# Patient Record
Sex: Female | Born: 1945
Health system: Southern US, Community
[De-identification: ages and names within clinical notes are randomized; demographics above are authoritative.]

---

## 2011-05-25 ENCOUNTER — Other Ambulatory Visit: Payer: Self-pay | Admitting: Neurosurgery

## 2011-05-25 DIAGNOSIS — M4722 Other spondylosis with radiculopathy, cervical region: Secondary | ICD-10-CM

## 2011-05-27 ENCOUNTER — Ambulatory Visit
Admission: RE | Admit: 2011-05-27 | Discharge: 2011-05-27 | Disposition: A | Discharge status: Home / Self Care | Payer: Medicare Other | Source: Ambulatory Visit | Attending: Neurosurgery | Admitting: Neurosurgery

## 2011-05-27 DIAGNOSIS — M4722 Other spondylosis with radiculopathy, cervical region: Secondary | ICD-10-CM

## 2011-05-27 MED ORDER — TRIAMCINOLONE ACETONIDE 40 MG/ML IJ SUSP (RADIOLOGY)
60.0000 mg | Freq: Once | INTRAMUSCULAR | Status: AC
  Administered 2011-05-27: 60 mg via EPIDURAL

## 2011-05-27 MED ORDER — IOHEXOL 300 MG/ML  SOLN
1.0000 mL | Freq: Once | INTRAMUSCULAR | Status: AC | PRN
  Administered 2011-05-27: 1 mL via EPIDURAL

## 2015-03-09 ENCOUNTER — Emergency Department
Admission: EM | Admit: 2015-03-09 | Discharge: 2015-03-09 | Discharge status: Absent without leave | Payer: Medicare Other

## 2015-11-24 DIAGNOSIS — Z789 Other specified health status: Secondary | ICD-10-CM | POA: Diagnosis not present

## 2015-11-24 DIAGNOSIS — E1165 Type 2 diabetes mellitus with hyperglycemia: Secondary | ICD-10-CM | POA: Diagnosis not present

## 2015-11-24 DIAGNOSIS — I10 Essential (primary) hypertension: Secondary | ICD-10-CM | POA: Diagnosis not present

## 2016-02-27 DIAGNOSIS — E1165 Type 2 diabetes mellitus with hyperglycemia: Secondary | ICD-10-CM | POA: Diagnosis not present

## 2016-02-27 DIAGNOSIS — Z299 Encounter for prophylactic measures, unspecified: Secondary | ICD-10-CM | POA: Diagnosis not present

## 2016-02-27 DIAGNOSIS — E785 Hyperlipidemia, unspecified: Secondary | ICD-10-CM | POA: Diagnosis not present

## 2016-02-27 DIAGNOSIS — I10 Essential (primary) hypertension: Secondary | ICD-10-CM | POA: Diagnosis not present

## 2016-06-04 DIAGNOSIS — I10 Essential (primary) hypertension: Secondary | ICD-10-CM | POA: Diagnosis not present

## 2016-06-04 DIAGNOSIS — Z299 Encounter for prophylactic measures, unspecified: Secondary | ICD-10-CM | POA: Diagnosis not present

## 2016-06-04 DIAGNOSIS — E1165 Type 2 diabetes mellitus with hyperglycemia: Secondary | ICD-10-CM | POA: Diagnosis not present

## 2016-07-18 DIAGNOSIS — Z23 Encounter for immunization: Secondary | ICD-10-CM | POA: Diagnosis not present

## 2016-08-16 DIAGNOSIS — Z79899 Other long term (current) drug therapy: Secondary | ICD-10-CM | POA: Diagnosis not present

## 2016-08-16 DIAGNOSIS — Z1389 Encounter for screening for other disorder: Secondary | ICD-10-CM | POA: Diagnosis not present

## 2016-08-16 DIAGNOSIS — F419 Anxiety disorder, unspecified: Secondary | ICD-10-CM | POA: Diagnosis not present

## 2016-08-16 DIAGNOSIS — Z299 Encounter for prophylactic measures, unspecified: Secondary | ICD-10-CM | POA: Diagnosis not present

## 2016-08-16 DIAGNOSIS — E7800 Pure hypercholesterolemia, unspecified: Secondary | ICD-10-CM | POA: Diagnosis not present

## 2016-08-16 DIAGNOSIS — Z7189 Other specified counseling: Secondary | ICD-10-CM | POA: Diagnosis not present

## 2016-08-16 DIAGNOSIS — Z0000 Encounter for general adult medical examination without abnormal findings: Secondary | ICD-10-CM | POA: Diagnosis not present

## 2016-08-16 DIAGNOSIS — Z1211 Encounter for screening for malignant neoplasm of colon: Secondary | ICD-10-CM | POA: Diagnosis not present

## 2016-08-16 DIAGNOSIS — Z6830 Body mass index (BMI) 30.0-30.9, adult: Secondary | ICD-10-CM | POA: Diagnosis not present

## 2016-08-16 DIAGNOSIS — E1165 Type 2 diabetes mellitus with hyperglycemia: Secondary | ICD-10-CM | POA: Diagnosis not present

## 2016-09-01 DIAGNOSIS — E119 Type 2 diabetes mellitus without complications: Secondary | ICD-10-CM | POA: Diagnosis not present

## 2016-09-01 DIAGNOSIS — H40053 Ocular hypertension, bilateral: Secondary | ICD-10-CM | POA: Diagnosis not present

## 2016-09-16 DIAGNOSIS — H40002 Preglaucoma, unspecified, left eye: Secondary | ICD-10-CM | POA: Diagnosis not present

## 2016-09-16 DIAGNOSIS — E2839 Other primary ovarian failure: Secondary | ICD-10-CM | POA: Diagnosis not present

## 2016-09-16 DIAGNOSIS — H40053 Ocular hypertension, bilateral: Secondary | ICD-10-CM | POA: Diagnosis not present

## 2016-11-17 DIAGNOSIS — H04123 Dry eye syndrome of bilateral lacrimal glands: Secondary | ICD-10-CM | POA: Diagnosis not present

## 2016-11-17 DIAGNOSIS — H40053 Ocular hypertension, bilateral: Secondary | ICD-10-CM | POA: Diagnosis not present

## 2017-06-19 DIAGNOSIS — Z23 Encounter for immunization: Secondary | ICD-10-CM | POA: Diagnosis not present

## 2017-08-09 DIAGNOSIS — H40053 Ocular hypertension, bilateral: Secondary | ICD-10-CM | POA: Diagnosis not present

## 2017-08-19 DIAGNOSIS — F419 Anxiety disorder, unspecified: Secondary | ICD-10-CM | POA: Diagnosis not present

## 2017-08-19 DIAGNOSIS — Z6831 Body mass index (BMI) 31.0-31.9, adult: Secondary | ICD-10-CM | POA: Diagnosis not present

## 2017-08-19 DIAGNOSIS — Z299 Encounter for prophylactic measures, unspecified: Secondary | ICD-10-CM | POA: Diagnosis not present

## 2017-08-19 DIAGNOSIS — Z79899 Other long term (current) drug therapy: Secondary | ICD-10-CM | POA: Diagnosis not present

## 2017-08-19 DIAGNOSIS — R5383 Other fatigue: Secondary | ICD-10-CM | POA: Diagnosis not present

## 2017-08-19 DIAGNOSIS — E1165 Type 2 diabetes mellitus with hyperglycemia: Secondary | ICD-10-CM | POA: Diagnosis not present

## 2017-08-19 DIAGNOSIS — Z0000 Encounter for general adult medical examination without abnormal findings: Secondary | ICD-10-CM | POA: Diagnosis not present

## 2017-08-19 DIAGNOSIS — I10 Essential (primary) hypertension: Secondary | ICD-10-CM | POA: Diagnosis not present

## 2017-08-19 DIAGNOSIS — Z1331 Encounter for screening for depression: Secondary | ICD-10-CM | POA: Diagnosis not present

## 2017-08-19 DIAGNOSIS — E7800 Pure hypercholesterolemia, unspecified: Secondary | ICD-10-CM | POA: Diagnosis not present

## 2017-08-19 DIAGNOSIS — Z1339 Encounter for screening examination for other mental health and behavioral disorders: Secondary | ICD-10-CM | POA: Diagnosis not present

## 2017-08-19 DIAGNOSIS — Z1211 Encounter for screening for malignant neoplasm of colon: Secondary | ICD-10-CM | POA: Diagnosis not present

## 2017-08-19 DIAGNOSIS — Z7189 Other specified counseling: Secondary | ICD-10-CM | POA: Diagnosis not present

## 2017-11-29 DIAGNOSIS — Z6831 Body mass index (BMI) 31.0-31.9, adult: Secondary | ICD-10-CM | POA: Diagnosis not present

## 2017-11-29 DIAGNOSIS — I10 Essential (primary) hypertension: Secondary | ICD-10-CM | POA: Diagnosis not present

## 2017-11-29 DIAGNOSIS — E1165 Type 2 diabetes mellitus with hyperglycemia: Secondary | ICD-10-CM | POA: Diagnosis not present

## 2017-11-29 DIAGNOSIS — Z789 Other specified health status: Secondary | ICD-10-CM | POA: Diagnosis not present

## 2017-11-29 DIAGNOSIS — Z299 Encounter for prophylactic measures, unspecified: Secondary | ICD-10-CM | POA: Diagnosis not present

## 2018-01-25 DIAGNOSIS — H40053 Ocular hypertension, bilateral: Secondary | ICD-10-CM | POA: Diagnosis not present

## 2018-03-16 DIAGNOSIS — Z713 Dietary counseling and surveillance: Secondary | ICD-10-CM | POA: Diagnosis not present

## 2018-03-16 DIAGNOSIS — Z299 Encounter for prophylactic measures, unspecified: Secondary | ICD-10-CM | POA: Diagnosis not present

## 2018-03-16 DIAGNOSIS — I10 Essential (primary) hypertension: Secondary | ICD-10-CM | POA: Diagnosis not present

## 2018-03-16 DIAGNOSIS — Z6831 Body mass index (BMI) 31.0-31.9, adult: Secondary | ICD-10-CM | POA: Diagnosis not present

## 2018-03-16 DIAGNOSIS — E1165 Type 2 diabetes mellitus with hyperglycemia: Secondary | ICD-10-CM | POA: Diagnosis not present

## 2018-04-04 DIAGNOSIS — I10 Essential (primary) hypertension: Secondary | ICD-10-CM | POA: Diagnosis not present

## 2018-04-04 DIAGNOSIS — M4802 Spinal stenosis, cervical region: Secondary | ICD-10-CM | POA: Diagnosis not present

## 2018-04-04 DIAGNOSIS — Z299 Encounter for prophylactic measures, unspecified: Secondary | ICD-10-CM | POA: Diagnosis not present

## 2018-04-04 DIAGNOSIS — M50323 Other cervical disc degeneration at C6-C7 level: Secondary | ICD-10-CM | POA: Diagnosis not present

## 2018-04-04 DIAGNOSIS — M542 Cervicalgia: Secondary | ICD-10-CM | POA: Diagnosis not present

## 2018-04-04 DIAGNOSIS — Z6830 Body mass index (BMI) 30.0-30.9, adult: Secondary | ICD-10-CM | POA: Diagnosis not present

## 2018-04-04 DIAGNOSIS — E1165 Type 2 diabetes mellitus with hyperglycemia: Secondary | ICD-10-CM | POA: Diagnosis not present

## 2018-04-04 DIAGNOSIS — M50322 Other cervical disc degeneration at C5-C6 level: Secondary | ICD-10-CM | POA: Diagnosis not present

## 2018-04-10 DIAGNOSIS — M2560 Stiffness of unspecified joint, not elsewhere classified: Secondary | ICD-10-CM | POA: Diagnosis not present

## 2018-04-10 DIAGNOSIS — M25511 Pain in right shoulder: Secondary | ICD-10-CM | POA: Diagnosis not present

## 2018-04-10 DIAGNOSIS — M6250 Muscle wasting and atrophy, not elsewhere classified, unspecified site: Secondary | ICD-10-CM | POA: Diagnosis not present

## 2018-04-10 DIAGNOSIS — M542 Cervicalgia: Secondary | ICD-10-CM | POA: Diagnosis not present

## 2018-04-18 DIAGNOSIS — M542 Cervicalgia: Secondary | ICD-10-CM | POA: Diagnosis not present

## 2018-04-18 DIAGNOSIS — M2560 Stiffness of unspecified joint, not elsewhere classified: Secondary | ICD-10-CM | POA: Diagnosis not present

## 2018-04-18 DIAGNOSIS — M25511 Pain in right shoulder: Secondary | ICD-10-CM | POA: Diagnosis not present

## 2018-04-18 DIAGNOSIS — M6250 Muscle wasting and atrophy, not elsewhere classified, unspecified site: Secondary | ICD-10-CM | POA: Diagnosis not present

## 2018-04-20 DIAGNOSIS — M542 Cervicalgia: Secondary | ICD-10-CM | POA: Diagnosis not present

## 2018-04-20 DIAGNOSIS — M25511 Pain in right shoulder: Secondary | ICD-10-CM | POA: Diagnosis not present

## 2018-04-20 DIAGNOSIS — M6250 Muscle wasting and atrophy, not elsewhere classified, unspecified site: Secondary | ICD-10-CM | POA: Diagnosis not present

## 2018-04-20 DIAGNOSIS — M2560 Stiffness of unspecified joint, not elsewhere classified: Secondary | ICD-10-CM | POA: Diagnosis not present

## 2018-04-24 DIAGNOSIS — M25511 Pain in right shoulder: Secondary | ICD-10-CM | POA: Diagnosis not present

## 2018-04-24 DIAGNOSIS — M6250 Muscle wasting and atrophy, not elsewhere classified, unspecified site: Secondary | ICD-10-CM | POA: Diagnosis not present

## 2018-04-24 DIAGNOSIS — M2560 Stiffness of unspecified joint, not elsewhere classified: Secondary | ICD-10-CM | POA: Diagnosis not present

## 2018-04-24 DIAGNOSIS — M542 Cervicalgia: Secondary | ICD-10-CM | POA: Diagnosis not present

## 2018-04-25 DIAGNOSIS — M25511 Pain in right shoulder: Secondary | ICD-10-CM | POA: Diagnosis not present

## 2018-04-25 DIAGNOSIS — M542 Cervicalgia: Secondary | ICD-10-CM | POA: Diagnosis not present

## 2018-04-25 DIAGNOSIS — M6250 Muscle wasting and atrophy, not elsewhere classified, unspecified site: Secondary | ICD-10-CM | POA: Diagnosis not present

## 2018-04-25 DIAGNOSIS — M2560 Stiffness of unspecified joint, not elsewhere classified: Secondary | ICD-10-CM | POA: Diagnosis not present

## 2018-04-28 DIAGNOSIS — Z299 Encounter for prophylactic measures, unspecified: Secondary | ICD-10-CM | POA: Diagnosis not present

## 2018-04-28 DIAGNOSIS — E1165 Type 2 diabetes mellitus with hyperglycemia: Secondary | ICD-10-CM | POA: Diagnosis not present

## 2018-04-28 DIAGNOSIS — Z6829 Body mass index (BMI) 29.0-29.9, adult: Secondary | ICD-10-CM | POA: Diagnosis not present

## 2018-04-28 DIAGNOSIS — I10 Essential (primary) hypertension: Secondary | ICD-10-CM | POA: Diagnosis not present

## 2018-04-28 DIAGNOSIS — M542 Cervicalgia: Secondary | ICD-10-CM | POA: Diagnosis not present

## 2018-05-02 DIAGNOSIS — M6250 Muscle wasting and atrophy, not elsewhere classified, unspecified site: Secondary | ICD-10-CM | POA: Diagnosis not present

## 2018-05-02 DIAGNOSIS — M25511 Pain in right shoulder: Secondary | ICD-10-CM | POA: Diagnosis not present

## 2018-05-02 DIAGNOSIS — M2560 Stiffness of unspecified joint, not elsewhere classified: Secondary | ICD-10-CM | POA: Diagnosis not present

## 2018-05-02 DIAGNOSIS — M542 Cervicalgia: Secondary | ICD-10-CM | POA: Diagnosis not present

## 2018-05-04 DIAGNOSIS — M25511 Pain in right shoulder: Secondary | ICD-10-CM | POA: Diagnosis not present

## 2018-05-04 DIAGNOSIS — M2560 Stiffness of unspecified joint, not elsewhere classified: Secondary | ICD-10-CM | POA: Diagnosis not present

## 2018-05-04 DIAGNOSIS — M6250 Muscle wasting and atrophy, not elsewhere classified, unspecified site: Secondary | ICD-10-CM | POA: Diagnosis not present

## 2018-05-04 DIAGNOSIS — M542 Cervicalgia: Secondary | ICD-10-CM | POA: Diagnosis not present

## 2018-05-05 DIAGNOSIS — M50221 Other cervical disc displacement at C4-C5 level: Secondary | ICD-10-CM | POA: Diagnosis not present

## 2018-05-05 DIAGNOSIS — M5023 Other cervical disc displacement, cervicothoracic region: Secondary | ICD-10-CM | POA: Diagnosis not present

## 2018-05-05 DIAGNOSIS — M4803 Spinal stenosis, cervicothoracic region: Secondary | ICD-10-CM | POA: Diagnosis not present

## 2018-05-05 DIAGNOSIS — M50222 Other cervical disc displacement at C5-C6 level: Secondary | ICD-10-CM | POA: Diagnosis not present

## 2018-05-05 DIAGNOSIS — M47812 Spondylosis without myelopathy or radiculopathy, cervical region: Secondary | ICD-10-CM | POA: Diagnosis not present

## 2018-05-05 DIAGNOSIS — M5410 Radiculopathy, site unspecified: Secondary | ICD-10-CM | POA: Diagnosis not present

## 2018-05-05 DIAGNOSIS — M4802 Spinal stenosis, cervical region: Secondary | ICD-10-CM | POA: Diagnosis not present

## 2018-05-09 DIAGNOSIS — M542 Cervicalgia: Secondary | ICD-10-CM | POA: Diagnosis not present

## 2018-05-09 DIAGNOSIS — I10 Essential (primary) hypertension: Secondary | ICD-10-CM | POA: Diagnosis not present

## 2018-05-09 DIAGNOSIS — Z299 Encounter for prophylactic measures, unspecified: Secondary | ICD-10-CM | POA: Diagnosis not present

## 2018-05-09 DIAGNOSIS — E663 Overweight: Secondary | ICD-10-CM | POA: Diagnosis not present

## 2018-05-09 DIAGNOSIS — Z713 Dietary counseling and surveillance: Secondary | ICD-10-CM | POA: Diagnosis not present

## 2018-06-09 DIAGNOSIS — M47812 Spondylosis without myelopathy or radiculopathy, cervical region: Secondary | ICD-10-CM | POA: Diagnosis not present

## 2018-06-22 DIAGNOSIS — F419 Anxiety disorder, unspecified: Secondary | ICD-10-CM | POA: Diagnosis not present

## 2018-06-22 DIAGNOSIS — E1165 Type 2 diabetes mellitus with hyperglycemia: Secondary | ICD-10-CM | POA: Diagnosis not present

## 2018-06-22 DIAGNOSIS — Z299 Encounter for prophylactic measures, unspecified: Secondary | ICD-10-CM | POA: Diagnosis not present

## 2018-06-22 DIAGNOSIS — Z6829 Body mass index (BMI) 29.0-29.9, adult: Secondary | ICD-10-CM | POA: Diagnosis not present

## 2018-06-22 DIAGNOSIS — I10 Essential (primary) hypertension: Secondary | ICD-10-CM | POA: Diagnosis not present

## 2018-07-30 DIAGNOSIS — Z23 Encounter for immunization: Secondary | ICD-10-CM | POA: Diagnosis not present

## 2018-08-02 DIAGNOSIS — H40053 Ocular hypertension, bilateral: Secondary | ICD-10-CM | POA: Diagnosis not present

## 2018-12-28 DIAGNOSIS — I10 Essential (primary) hypertension: Secondary | ICD-10-CM | POA: Diagnosis not present

## 2018-12-28 DIAGNOSIS — Z6831 Body mass index (BMI) 31.0-31.9, adult: Secondary | ICD-10-CM | POA: Diagnosis not present

## 2018-12-28 DIAGNOSIS — Z713 Dietary counseling and surveillance: Secondary | ICD-10-CM | POA: Diagnosis not present

## 2018-12-28 DIAGNOSIS — Z299 Encounter for prophylactic measures, unspecified: Secondary | ICD-10-CM | POA: Diagnosis not present

## 2018-12-28 DIAGNOSIS — E1165 Type 2 diabetes mellitus with hyperglycemia: Secondary | ICD-10-CM | POA: Diagnosis not present

## 2019-04-05 DIAGNOSIS — Z6829 Body mass index (BMI) 29.0-29.9, adult: Secondary | ICD-10-CM | POA: Diagnosis not present

## 2019-04-05 DIAGNOSIS — Z299 Encounter for prophylactic measures, unspecified: Secondary | ICD-10-CM | POA: Diagnosis not present

## 2019-04-05 DIAGNOSIS — E1165 Type 2 diabetes mellitus with hyperglycemia: Secondary | ICD-10-CM | POA: Diagnosis not present

## 2019-04-05 DIAGNOSIS — I10 Essential (primary) hypertension: Secondary | ICD-10-CM | POA: Diagnosis not present

## 2019-04-05 DIAGNOSIS — Z713 Dietary counseling and surveillance: Secondary | ICD-10-CM | POA: Diagnosis not present

## 2019-07-11 DIAGNOSIS — Z23 Encounter for immunization: Secondary | ICD-10-CM | POA: Diagnosis not present

## 2019-07-11 DIAGNOSIS — Z6830 Body mass index (BMI) 30.0-30.9, adult: Secondary | ICD-10-CM | POA: Diagnosis not present

## 2019-07-11 DIAGNOSIS — E1165 Type 2 diabetes mellitus with hyperglycemia: Secondary | ICD-10-CM | POA: Diagnosis not present

## 2019-07-11 DIAGNOSIS — Z299 Encounter for prophylactic measures, unspecified: Secondary | ICD-10-CM | POA: Diagnosis not present

## 2019-07-11 DIAGNOSIS — R202 Paresthesia of skin: Secondary | ICD-10-CM | POA: Diagnosis not present

## 2019-07-11 DIAGNOSIS — I10 Essential (primary) hypertension: Secondary | ICD-10-CM | POA: Diagnosis not present

## 2019-07-17 DIAGNOSIS — E1140 Type 2 diabetes mellitus with diabetic neuropathy, unspecified: Secondary | ICD-10-CM | POA: Diagnosis not present

## 2019-07-17 DIAGNOSIS — M79604 Pain in right leg: Secondary | ICD-10-CM | POA: Diagnosis not present

## 2019-08-08 DIAGNOSIS — H40053 Ocular hypertension, bilateral: Secondary | ICD-10-CM | POA: Diagnosis not present

## 2019-08-10 DIAGNOSIS — E1142 Type 2 diabetes mellitus with diabetic polyneuropathy: Secondary | ICD-10-CM | POA: Diagnosis not present

## 2019-08-10 DIAGNOSIS — Z6830 Body mass index (BMI) 30.0-30.9, adult: Secondary | ICD-10-CM | POA: Diagnosis not present

## 2019-08-10 DIAGNOSIS — Z299 Encounter for prophylactic measures, unspecified: Secondary | ICD-10-CM | POA: Diagnosis not present

## 2019-08-10 DIAGNOSIS — I10 Essential (primary) hypertension: Secondary | ICD-10-CM | POA: Diagnosis not present

## 2019-08-10 DIAGNOSIS — E1165 Type 2 diabetes mellitus with hyperglycemia: Secondary | ICD-10-CM | POA: Diagnosis not present

## 2019-08-10 DIAGNOSIS — E119 Type 2 diabetes mellitus without complications: Secondary | ICD-10-CM | POA: Diagnosis not present

## 2019-08-10 DIAGNOSIS — R202 Paresthesia of skin: Secondary | ICD-10-CM | POA: Diagnosis not present

## 2019-08-30 DIAGNOSIS — Z03818 Encounter for observation for suspected exposure to other biological agents ruled out: Secondary | ICD-10-CM | POA: Diagnosis not present

## 2019-08-30 DIAGNOSIS — Z20828 Contact with and (suspected) exposure to other viral communicable diseases: Secondary | ICD-10-CM | POA: Diagnosis not present

## 2019-09-02 DIAGNOSIS — Z20828 Contact with and (suspected) exposure to other viral communicable diseases: Secondary | ICD-10-CM | POA: Diagnosis not present

## 2019-09-02 DIAGNOSIS — J069 Acute upper respiratory infection, unspecified: Secondary | ICD-10-CM | POA: Diagnosis not present

## 2019-09-02 DIAGNOSIS — R05 Cough: Secondary | ICD-10-CM | POA: Diagnosis not present

## 2019-09-02 DIAGNOSIS — Z03818 Encounter for observation for suspected exposure to other biological agents ruled out: Secondary | ICD-10-CM | POA: Diagnosis not present

## 2019-09-16 DIAGNOSIS — Z7984 Long term (current) use of oral hypoglycemic drugs: Secondary | ICD-10-CM | POA: Diagnosis not present

## 2019-09-16 DIAGNOSIS — E860 Dehydration: Secondary | ICD-10-CM | POA: Diagnosis not present

## 2019-09-16 DIAGNOSIS — E119 Type 2 diabetes mellitus without complications: Secondary | ICD-10-CM | POA: Diagnosis not present

## 2019-09-16 DIAGNOSIS — I10 Essential (primary) hypertension: Secondary | ICD-10-CM | POA: Diagnosis not present

## 2019-09-16 DIAGNOSIS — Z79899 Other long term (current) drug therapy: Secondary | ICD-10-CM | POA: Diagnosis not present

## 2019-09-16 DIAGNOSIS — U071 COVID-19: Secondary | ICD-10-CM | POA: Diagnosis not present

## 2019-09-16 DIAGNOSIS — M6281 Muscle weakness (generalized): Secondary | ICD-10-CM | POA: Diagnosis not present

## 2019-09-17 DIAGNOSIS — U071 COVID-19: Secondary | ICD-10-CM | POA: Diagnosis not present

## 2019-09-17 DIAGNOSIS — J1289 Other viral pneumonia: Secondary | ICD-10-CM | POA: Diagnosis not present

## 2019-09-17 DIAGNOSIS — B9729 Other coronavirus as the cause of diseases classified elsewhere: Secondary | ICD-10-CM | POA: Diagnosis not present

## 2019-09-17 DIAGNOSIS — J9601 Acute respiratory failure with hypoxia: Secondary | ICD-10-CM | POA: Diagnosis not present

## 2019-09-18 DIAGNOSIS — J1289 Other viral pneumonia: Secondary | ICD-10-CM | POA: Diagnosis not present

## 2019-09-18 DIAGNOSIS — B9729 Other coronavirus as the cause of diseases classified elsewhere: Secondary | ICD-10-CM | POA: Diagnosis not present

## 2019-09-18 DIAGNOSIS — J9601 Acute respiratory failure with hypoxia: Secondary | ICD-10-CM | POA: Diagnosis not present

## 2019-09-18 DIAGNOSIS — U071 COVID-19: Secondary | ICD-10-CM | POA: Diagnosis not present

## 2019-10-11 DIAGNOSIS — E119 Type 2 diabetes mellitus without complications: Secondary | ICD-10-CM | POA: Diagnosis not present

## 2019-10-15 DIAGNOSIS — I10 Essential (primary) hypertension: Secondary | ICD-10-CM | POA: Diagnosis not present

## 2019-10-15 DIAGNOSIS — E1165 Type 2 diabetes mellitus with hyperglycemia: Secondary | ICD-10-CM | POA: Diagnosis not present

## 2019-10-15 DIAGNOSIS — Z6829 Body mass index (BMI) 29.0-29.9, adult: Secondary | ICD-10-CM | POA: Diagnosis not present

## 2019-10-15 DIAGNOSIS — Z299 Encounter for prophylactic measures, unspecified: Secondary | ICD-10-CM | POA: Diagnosis not present

## 2019-10-15 DIAGNOSIS — Z789 Other specified health status: Secondary | ICD-10-CM | POA: Diagnosis not present

## 2019-10-15 DIAGNOSIS — R05 Cough: Secondary | ICD-10-CM | POA: Diagnosis not present

## 2019-10-15 DIAGNOSIS — U071 COVID-19: Secondary | ICD-10-CM | POA: Diagnosis not present

## 2019-11-08 DIAGNOSIS — E119 Type 2 diabetes mellitus without complications: Secondary | ICD-10-CM | POA: Diagnosis not present

## 2019-11-21 DIAGNOSIS — Z79899 Other long term (current) drug therapy: Secondary | ICD-10-CM | POA: Diagnosis not present

## 2019-11-21 DIAGNOSIS — E559 Vitamin D deficiency, unspecified: Secondary | ICD-10-CM | POA: Diagnosis not present

## 2019-11-21 DIAGNOSIS — F419 Anxiety disorder, unspecified: Secondary | ICD-10-CM | POA: Diagnosis not present

## 2019-11-21 DIAGNOSIS — Z299 Encounter for prophylactic measures, unspecified: Secondary | ICD-10-CM | POA: Diagnosis not present

## 2019-11-21 DIAGNOSIS — Z1331 Encounter for screening for depression: Secondary | ICD-10-CM | POA: Diagnosis not present

## 2019-11-21 DIAGNOSIS — E785 Hyperlipidemia, unspecified: Secondary | ICD-10-CM | POA: Diagnosis not present

## 2019-11-21 DIAGNOSIS — Z6830 Body mass index (BMI) 30.0-30.9, adult: Secondary | ICD-10-CM | POA: Diagnosis not present

## 2019-11-21 DIAGNOSIS — Z0000 Encounter for general adult medical examination without abnormal findings: Secondary | ICD-10-CM | POA: Diagnosis not present

## 2019-11-21 DIAGNOSIS — Z1211 Encounter for screening for malignant neoplasm of colon: Secondary | ICD-10-CM | POA: Diagnosis not present

## 2019-11-21 DIAGNOSIS — Z7189 Other specified counseling: Secondary | ICD-10-CM | POA: Diagnosis not present

## 2019-11-21 DIAGNOSIS — R5383 Other fatigue: Secondary | ICD-10-CM | POA: Diagnosis not present

## 2019-11-21 DIAGNOSIS — Z1339 Encounter for screening examination for other mental health and behavioral disorders: Secondary | ICD-10-CM | POA: Diagnosis not present

## 2019-11-21 DIAGNOSIS — I10 Essential (primary) hypertension: Secondary | ICD-10-CM | POA: Diagnosis not present

## 2019-11-23 DIAGNOSIS — E2839 Other primary ovarian failure: Secondary | ICD-10-CM | POA: Diagnosis not present

## 2019-12-09 DIAGNOSIS — E119 Type 2 diabetes mellitus without complications: Secondary | ICD-10-CM | POA: Diagnosis not present

## 2019-12-14 DIAGNOSIS — Z23 Encounter for immunization: Secondary | ICD-10-CM | POA: Diagnosis not present

## 2020-01-08 DIAGNOSIS — E119 Type 2 diabetes mellitus without complications: Secondary | ICD-10-CM | POA: Diagnosis not present

## 2020-01-11 DIAGNOSIS — Z23 Encounter for immunization: Secondary | ICD-10-CM | POA: Diagnosis not present

## 2020-01-21 DIAGNOSIS — E1165 Type 2 diabetes mellitus with hyperglycemia: Secondary | ICD-10-CM | POA: Diagnosis not present

## 2020-01-21 DIAGNOSIS — Z299 Encounter for prophylactic measures, unspecified: Secondary | ICD-10-CM | POA: Diagnosis not present

## 2020-01-21 DIAGNOSIS — I10 Essential (primary) hypertension: Secondary | ICD-10-CM | POA: Diagnosis not present

## 2020-02-06 DIAGNOSIS — H40053 Ocular hypertension, bilateral: Secondary | ICD-10-CM | POA: Diagnosis not present

## 2020-02-08 DIAGNOSIS — E119 Type 2 diabetes mellitus without complications: Secondary | ICD-10-CM | POA: Diagnosis not present

## 2020-03-09 DIAGNOSIS — E119 Type 2 diabetes mellitus without complications: Secondary | ICD-10-CM | POA: Diagnosis not present

## 2020-03-10 DIAGNOSIS — I10 Essential (primary) hypertension: Secondary | ICD-10-CM | POA: Diagnosis not present

## 2020-03-10 DIAGNOSIS — F329 Major depressive disorder, single episode, unspecified: Secondary | ICD-10-CM | POA: Diagnosis not present

## 2020-04-09 DIAGNOSIS — E119 Type 2 diabetes mellitus without complications: Secondary | ICD-10-CM | POA: Diagnosis not present

## 2020-04-09 DIAGNOSIS — F329 Major depressive disorder, single episode, unspecified: Secondary | ICD-10-CM | POA: Diagnosis not present

## 2020-04-09 DIAGNOSIS — I10 Essential (primary) hypertension: Secondary | ICD-10-CM | POA: Diagnosis not present

## 2020-04-24 DIAGNOSIS — E1165 Type 2 diabetes mellitus with hyperglycemia: Secondary | ICD-10-CM | POA: Diagnosis not present

## 2020-04-24 DIAGNOSIS — Z299 Encounter for prophylactic measures, unspecified: Secondary | ICD-10-CM | POA: Diagnosis not present

## 2020-04-24 DIAGNOSIS — Z6829 Body mass index (BMI) 29.0-29.9, adult: Secondary | ICD-10-CM | POA: Diagnosis not present

## 2020-04-24 DIAGNOSIS — I10 Essential (primary) hypertension: Secondary | ICD-10-CM | POA: Diagnosis not present

## 2020-04-24 DIAGNOSIS — E1142 Type 2 diabetes mellitus with diabetic polyneuropathy: Secondary | ICD-10-CM | POA: Diagnosis not present

## 2020-05-09 DIAGNOSIS — E119 Type 2 diabetes mellitus without complications: Secondary | ICD-10-CM | POA: Diagnosis not present

## 2020-06-10 DIAGNOSIS — E119 Type 2 diabetes mellitus without complications: Secondary | ICD-10-CM | POA: Diagnosis not present

## 2020-06-10 DIAGNOSIS — I10 Essential (primary) hypertension: Secondary | ICD-10-CM | POA: Diagnosis not present

## 2020-06-10 DIAGNOSIS — F329 Major depressive disorder, single episode, unspecified: Secondary | ICD-10-CM | POA: Diagnosis not present

## 2020-07-10 DIAGNOSIS — E119 Type 2 diabetes mellitus without complications: Secondary | ICD-10-CM | POA: Diagnosis not present

## 2020-07-11 DIAGNOSIS — Z23 Encounter for immunization: Secondary | ICD-10-CM | POA: Diagnosis not present

## 2020-08-08 DIAGNOSIS — F329 Major depressive disorder, single episode, unspecified: Secondary | ICD-10-CM | POA: Diagnosis not present

## 2020-08-08 DIAGNOSIS — I10 Essential (primary) hypertension: Secondary | ICD-10-CM | POA: Diagnosis not present

## 2020-08-09 DIAGNOSIS — E119 Type 2 diabetes mellitus without complications: Secondary | ICD-10-CM | POA: Diagnosis not present

## 2020-08-21 DIAGNOSIS — E1142 Type 2 diabetes mellitus with diabetic polyneuropathy: Secondary | ICD-10-CM | POA: Diagnosis not present

## 2020-08-21 DIAGNOSIS — Z299 Encounter for prophylactic measures, unspecified: Secondary | ICD-10-CM | POA: Diagnosis not present

## 2020-08-21 DIAGNOSIS — I10 Essential (primary) hypertension: Secondary | ICD-10-CM | POA: Diagnosis not present

## 2020-08-21 DIAGNOSIS — E1165 Type 2 diabetes mellitus with hyperglycemia: Secondary | ICD-10-CM | POA: Diagnosis not present

## 2020-08-25 DIAGNOSIS — Z23 Encounter for immunization: Secondary | ICD-10-CM | POA: Diagnosis not present

## 2020-09-09 DIAGNOSIS — I10 Essential (primary) hypertension: Secondary | ICD-10-CM | POA: Diagnosis not present

## 2020-09-09 DIAGNOSIS — E119 Type 2 diabetes mellitus without complications: Secondary | ICD-10-CM | POA: Diagnosis not present

## 2020-09-09 DIAGNOSIS — F329 Major depressive disorder, single episode, unspecified: Secondary | ICD-10-CM | POA: Diagnosis not present

## 2020-10-09 DIAGNOSIS — E119 Type 2 diabetes mellitus without complications: Secondary | ICD-10-CM | POA: Diagnosis not present

## 2020-10-10 DIAGNOSIS — I10 Essential (primary) hypertension: Secondary | ICD-10-CM | POA: Diagnosis not present

## 2020-10-10 DIAGNOSIS — F329 Major depressive disorder, single episode, unspecified: Secondary | ICD-10-CM | POA: Diagnosis not present

## 2020-11-10 DIAGNOSIS — I10 Essential (primary) hypertension: Secondary | ICD-10-CM | POA: Diagnosis not present

## 2020-11-10 DIAGNOSIS — F329 Major depressive disorder, single episode, unspecified: Secondary | ICD-10-CM | POA: Diagnosis not present

## 2020-11-10 DIAGNOSIS — E119 Type 2 diabetes mellitus without complications: Secondary | ICD-10-CM | POA: Diagnosis not present

## 2020-11-27 DIAGNOSIS — Z1331 Encounter for screening for depression: Secondary | ICD-10-CM | POA: Diagnosis not present

## 2020-11-27 DIAGNOSIS — Z0000 Encounter for general adult medical examination without abnormal findings: Secondary | ICD-10-CM | POA: Diagnosis not present

## 2020-11-27 DIAGNOSIS — Z79899 Other long term (current) drug therapy: Secondary | ICD-10-CM | POA: Diagnosis not present

## 2020-11-27 DIAGNOSIS — Z299 Encounter for prophylactic measures, unspecified: Secondary | ICD-10-CM | POA: Diagnosis not present

## 2020-11-27 DIAGNOSIS — I10 Essential (primary) hypertension: Secondary | ICD-10-CM | POA: Diagnosis not present

## 2020-11-27 DIAGNOSIS — E1165 Type 2 diabetes mellitus with hyperglycemia: Secondary | ICD-10-CM | POA: Diagnosis not present

## 2020-11-27 DIAGNOSIS — R5383 Other fatigue: Secondary | ICD-10-CM | POA: Diagnosis not present

## 2020-11-27 DIAGNOSIS — E7800 Pure hypercholesterolemia, unspecified: Secondary | ICD-10-CM | POA: Diagnosis not present

## 2020-11-27 DIAGNOSIS — Z1339 Encounter for screening examination for other mental health and behavioral disorders: Secondary | ICD-10-CM | POA: Diagnosis not present

## 2020-11-27 DIAGNOSIS — E559 Vitamin D deficiency, unspecified: Secondary | ICD-10-CM | POA: Diagnosis not present

## 2020-11-27 DIAGNOSIS — Z6830 Body mass index (BMI) 30.0-30.9, adult: Secondary | ICD-10-CM | POA: Diagnosis not present

## 2020-11-27 DIAGNOSIS — Z7189 Other specified counseling: Secondary | ICD-10-CM | POA: Diagnosis not present

## 2020-12-08 DIAGNOSIS — E119 Type 2 diabetes mellitus without complications: Secondary | ICD-10-CM | POA: Diagnosis not present

## 2021-01-08 ENCOUNTER — Other Ambulatory Visit: Payer: Self-pay | Admitting: Internal Medicine

## 2021-01-08 DIAGNOSIS — E119 Type 2 diabetes mellitus without complications: Secondary | ICD-10-CM | POA: Diagnosis not present

## 2021-01-08 DIAGNOSIS — Z1231 Encounter for screening mammogram for malignant neoplasm of breast: Secondary | ICD-10-CM

## 2021-01-12 ENCOUNTER — Other Ambulatory Visit: Payer: Self-pay

## 2021-01-12 ENCOUNTER — Ambulatory Visit
Admission: RE | Admit: 2021-01-12 | Discharge: 2021-01-12 | Disposition: A | Discharge status: Home / Self Care | Payer: Medicare Other | Source: Ambulatory Visit | Attending: Internal Medicine | Admitting: Internal Medicine

## 2021-01-12 DIAGNOSIS — Z1231 Encounter for screening mammogram for malignant neoplasm of breast: Secondary | ICD-10-CM | POA: Diagnosis not present

## 2021-01-14 DIAGNOSIS — H2513 Age-related nuclear cataract, bilateral: Secondary | ICD-10-CM | POA: Diagnosis not present

## 2021-01-14 DIAGNOSIS — H40053 Ocular hypertension, bilateral: Secondary | ICD-10-CM | POA: Diagnosis not present

## 2021-02-06 DIAGNOSIS — E119 Type 2 diabetes mellitus without complications: Secondary | ICD-10-CM | POA: Diagnosis not present

## 2021-02-07 DIAGNOSIS — I10 Essential (primary) hypertension: Secondary | ICD-10-CM | POA: Diagnosis not present

## 2021-02-07 DIAGNOSIS — F329 Major depressive disorder, single episode, unspecified: Secondary | ICD-10-CM | POA: Diagnosis not present

## 2021-03-10 DIAGNOSIS — E119 Type 2 diabetes mellitus without complications: Secondary | ICD-10-CM | POA: Diagnosis not present

## 2021-03-13 DIAGNOSIS — E1122 Type 2 diabetes mellitus with diabetic chronic kidney disease: Secondary | ICD-10-CM | POA: Diagnosis not present

## 2021-03-13 DIAGNOSIS — N183 Chronic kidney disease, stage 3 unspecified: Secondary | ICD-10-CM | POA: Diagnosis not present

## 2021-03-13 DIAGNOSIS — E1165 Type 2 diabetes mellitus with hyperglycemia: Secondary | ICD-10-CM | POA: Diagnosis not present

## 2021-03-13 DIAGNOSIS — Z299 Encounter for prophylactic measures, unspecified: Secondary | ICD-10-CM | POA: Diagnosis not present

## 2021-03-13 DIAGNOSIS — Z6830 Body mass index (BMI) 30.0-30.9, adult: Secondary | ICD-10-CM | POA: Diagnosis not present

## 2021-03-13 DIAGNOSIS — I10 Essential (primary) hypertension: Secondary | ICD-10-CM | POA: Diagnosis not present

## 2021-04-09 DIAGNOSIS — E119 Type 2 diabetes mellitus without complications: Secondary | ICD-10-CM | POA: Diagnosis not present

## 2021-05-08 DIAGNOSIS — E119 Type 2 diabetes mellitus without complications: Secondary | ICD-10-CM | POA: Diagnosis not present

## 2021-05-10 DIAGNOSIS — F329 Major depressive disorder, single episode, unspecified: Secondary | ICD-10-CM | POA: Diagnosis not present

## 2021-05-10 DIAGNOSIS — I10 Essential (primary) hypertension: Secondary | ICD-10-CM | POA: Diagnosis not present

## 2021-06-10 DIAGNOSIS — E119 Type 2 diabetes mellitus without complications: Secondary | ICD-10-CM | POA: Diagnosis not present

## 2021-06-17 DIAGNOSIS — Z6830 Body mass index (BMI) 30.0-30.9, adult: Secondary | ICD-10-CM | POA: Diagnosis not present

## 2021-06-17 DIAGNOSIS — Z299 Encounter for prophylactic measures, unspecified: Secondary | ICD-10-CM | POA: Diagnosis not present

## 2021-06-17 DIAGNOSIS — E1165 Type 2 diabetes mellitus with hyperglycemia: Secondary | ICD-10-CM | POA: Diagnosis not present

## 2021-06-17 DIAGNOSIS — I10 Essential (primary) hypertension: Secondary | ICD-10-CM | POA: Diagnosis not present

## 2021-06-17 DIAGNOSIS — N183 Chronic kidney disease, stage 3 unspecified: Secondary | ICD-10-CM | POA: Diagnosis not present

## 2021-06-17 DIAGNOSIS — E1142 Type 2 diabetes mellitus with diabetic polyneuropathy: Secondary | ICD-10-CM | POA: Diagnosis not present

## 2021-06-17 DIAGNOSIS — E1122 Type 2 diabetes mellitus with diabetic chronic kidney disease: Secondary | ICD-10-CM | POA: Diagnosis not present

## 2021-07-10 DIAGNOSIS — I10 Essential (primary) hypertension: Secondary | ICD-10-CM | POA: Diagnosis not present

## 2021-07-10 DIAGNOSIS — E119 Type 2 diabetes mellitus without complications: Secondary | ICD-10-CM | POA: Diagnosis not present

## 2021-07-10 DIAGNOSIS — F329 Major depressive disorder, single episode, unspecified: Secondary | ICD-10-CM | POA: Diagnosis not present

## 2021-08-10 DIAGNOSIS — E119 Type 2 diabetes mellitus without complications: Secondary | ICD-10-CM | POA: Diagnosis not present

## 2021-08-19 DIAGNOSIS — Z23 Encounter for immunization: Secondary | ICD-10-CM | POA: Diagnosis not present

## 2021-09-09 DIAGNOSIS — E7800 Pure hypercholesterolemia, unspecified: Secondary | ICD-10-CM | POA: Diagnosis not present

## 2021-09-09 DIAGNOSIS — I10 Essential (primary) hypertension: Secondary | ICD-10-CM | POA: Diagnosis not present

## 2021-09-09 DIAGNOSIS — E119 Type 2 diabetes mellitus without complications: Secondary | ICD-10-CM | POA: Diagnosis not present

## 2021-09-18 DIAGNOSIS — E1122 Type 2 diabetes mellitus with diabetic chronic kidney disease: Secondary | ICD-10-CM | POA: Diagnosis not present

## 2021-09-18 DIAGNOSIS — N183 Chronic kidney disease, stage 3 unspecified: Secondary | ICD-10-CM | POA: Diagnosis not present

## 2021-09-18 DIAGNOSIS — I10 Essential (primary) hypertension: Secondary | ICD-10-CM | POA: Diagnosis not present

## 2021-09-18 DIAGNOSIS — Z299 Encounter for prophylactic measures, unspecified: Secondary | ICD-10-CM | POA: Diagnosis not present

## 2021-09-18 DIAGNOSIS — E1165 Type 2 diabetes mellitus with hyperglycemia: Secondary | ICD-10-CM | POA: Diagnosis not present

## 2021-10-09 DIAGNOSIS — E119 Type 2 diabetes mellitus without complications: Secondary | ICD-10-CM | POA: Diagnosis not present

## 2021-11-08 DIAGNOSIS — E119 Type 2 diabetes mellitus without complications: Secondary | ICD-10-CM | POA: Diagnosis not present

## 2021-11-30 DIAGNOSIS — Z299 Encounter for prophylactic measures, unspecified: Secondary | ICD-10-CM | POA: Diagnosis not present

## 2021-11-30 DIAGNOSIS — E559 Vitamin D deficiency, unspecified: Secondary | ICD-10-CM | POA: Diagnosis not present

## 2021-11-30 DIAGNOSIS — Z0000 Encounter for general adult medical examination without abnormal findings: Secondary | ICD-10-CM | POA: Diagnosis not present

## 2021-11-30 DIAGNOSIS — Z79899 Other long term (current) drug therapy: Secondary | ICD-10-CM | POA: Diagnosis not present

## 2021-11-30 DIAGNOSIS — Z7189 Other specified counseling: Secondary | ICD-10-CM | POA: Diagnosis not present

## 2021-11-30 DIAGNOSIS — Z789 Other specified health status: Secondary | ICD-10-CM | POA: Diagnosis not present

## 2021-11-30 DIAGNOSIS — R5383 Other fatigue: Secondary | ICD-10-CM | POA: Diagnosis not present

## 2021-11-30 DIAGNOSIS — Z1339 Encounter for screening examination for other mental health and behavioral disorders: Secondary | ICD-10-CM | POA: Diagnosis not present

## 2021-11-30 DIAGNOSIS — Z1331 Encounter for screening for depression: Secondary | ICD-10-CM | POA: Diagnosis not present

## 2021-11-30 DIAGNOSIS — E7800 Pure hypercholesterolemia, unspecified: Secondary | ICD-10-CM | POA: Diagnosis not present

## 2021-11-30 DIAGNOSIS — I10 Essential (primary) hypertension: Secondary | ICD-10-CM | POA: Diagnosis not present

## 2021-11-30 DIAGNOSIS — Z6829 Body mass index (BMI) 29.0-29.9, adult: Secondary | ICD-10-CM | POA: Diagnosis not present

## 2021-12-08 DIAGNOSIS — I10 Essential (primary) hypertension: Secondary | ICD-10-CM | POA: Diagnosis not present

## 2021-12-08 DIAGNOSIS — E119 Type 2 diabetes mellitus without complications: Secondary | ICD-10-CM | POA: Diagnosis not present

## 2021-12-08 DIAGNOSIS — F329 Major depressive disorder, single episode, unspecified: Secondary | ICD-10-CM | POA: Diagnosis not present

## 2021-12-22 DIAGNOSIS — E2839 Other primary ovarian failure: Secondary | ICD-10-CM | POA: Diagnosis not present

## 2022-01-07 DIAGNOSIS — E119 Type 2 diabetes mellitus without complications: Secondary | ICD-10-CM | POA: Diagnosis not present

## 2022-01-11 DIAGNOSIS — I10 Essential (primary) hypertension: Secondary | ICD-10-CM | POA: Diagnosis not present

## 2022-01-11 DIAGNOSIS — E1165 Type 2 diabetes mellitus with hyperglycemia: Secondary | ICD-10-CM | POA: Diagnosis not present

## 2022-01-11 DIAGNOSIS — E1122 Type 2 diabetes mellitus with diabetic chronic kidney disease: Secondary | ICD-10-CM | POA: Diagnosis not present

## 2022-01-11 DIAGNOSIS — N183 Chronic kidney disease, stage 3 unspecified: Secondary | ICD-10-CM | POA: Diagnosis not present

## 2022-01-11 DIAGNOSIS — Z299 Encounter for prophylactic measures, unspecified: Secondary | ICD-10-CM | POA: Diagnosis not present

## 2022-01-11 DIAGNOSIS — Z6830 Body mass index (BMI) 30.0-30.9, adult: Secondary | ICD-10-CM | POA: Diagnosis not present

## 2022-01-11 DIAGNOSIS — Z789 Other specified health status: Secondary | ICD-10-CM | POA: Diagnosis not present

## 2022-01-20 DIAGNOSIS — H2513 Age-related nuclear cataract, bilateral: Secondary | ICD-10-CM | POA: Diagnosis not present

## 2022-01-20 DIAGNOSIS — H40053 Ocular hypertension, bilateral: Secondary | ICD-10-CM | POA: Diagnosis not present

## 2022-02-07 DIAGNOSIS — E119 Type 2 diabetes mellitus without complications: Secondary | ICD-10-CM | POA: Diagnosis not present

## 2022-02-22 DIAGNOSIS — Z789 Other specified health status: Secondary | ICD-10-CM | POA: Diagnosis not present

## 2022-02-22 DIAGNOSIS — I10 Essential (primary) hypertension: Secondary | ICD-10-CM | POA: Diagnosis not present

## 2022-02-22 DIAGNOSIS — Z299 Encounter for prophylactic measures, unspecified: Secondary | ICD-10-CM | POA: Diagnosis not present

## 2022-02-22 DIAGNOSIS — Z6830 Body mass index (BMI) 30.0-30.9, adult: Secondary | ICD-10-CM | POA: Diagnosis not present

## 2022-02-22 DIAGNOSIS — E1165 Type 2 diabetes mellitus with hyperglycemia: Secondary | ICD-10-CM | POA: Diagnosis not present

## 2022-02-22 DIAGNOSIS — N183 Chronic kidney disease, stage 3 unspecified: Secondary | ICD-10-CM | POA: Diagnosis not present

## 2022-02-22 DIAGNOSIS — E1122 Type 2 diabetes mellitus with diabetic chronic kidney disease: Secondary | ICD-10-CM | POA: Diagnosis not present

## 2022-03-09 DIAGNOSIS — E119 Type 2 diabetes mellitus without complications: Secondary | ICD-10-CM | POA: Diagnosis not present

## 2022-04-08 DIAGNOSIS — E119 Type 2 diabetes mellitus without complications: Secondary | ICD-10-CM | POA: Diagnosis not present

## 2022-04-19 DIAGNOSIS — E1165 Type 2 diabetes mellitus with hyperglycemia: Secondary | ICD-10-CM | POA: Diagnosis not present

## 2022-04-19 DIAGNOSIS — N183 Chronic kidney disease, stage 3 unspecified: Secondary | ICD-10-CM | POA: Diagnosis not present

## 2022-04-19 DIAGNOSIS — Z6830 Body mass index (BMI) 30.0-30.9, adult: Secondary | ICD-10-CM | POA: Diagnosis not present

## 2022-04-19 DIAGNOSIS — Z299 Encounter for prophylactic measures, unspecified: Secondary | ICD-10-CM | POA: Diagnosis not present

## 2022-04-19 DIAGNOSIS — E1122 Type 2 diabetes mellitus with diabetic chronic kidney disease: Secondary | ICD-10-CM | POA: Diagnosis not present

## 2022-05-10 DIAGNOSIS — E119 Type 2 diabetes mellitus without complications: Secondary | ICD-10-CM | POA: Diagnosis not present

## 2022-05-31 DIAGNOSIS — M791 Myalgia, unspecified site: Secondary | ICD-10-CM | POA: Diagnosis not present

## 2022-05-31 DIAGNOSIS — E1142 Type 2 diabetes mellitus with diabetic polyneuropathy: Secondary | ICD-10-CM | POA: Diagnosis not present

## 2022-05-31 DIAGNOSIS — Z6829 Body mass index (BMI) 29.0-29.9, adult: Secondary | ICD-10-CM | POA: Diagnosis not present

## 2022-05-31 DIAGNOSIS — I10 Essential (primary) hypertension: Secondary | ICD-10-CM | POA: Diagnosis not present

## 2022-05-31 DIAGNOSIS — F32A Depression, unspecified: Secondary | ICD-10-CM | POA: Diagnosis not present

## 2022-05-31 DIAGNOSIS — Z299 Encounter for prophylactic measures, unspecified: Secondary | ICD-10-CM | POA: Diagnosis not present

## 2022-06-04 DIAGNOSIS — M545 Low back pain, unspecified: Secondary | ICD-10-CM | POA: Diagnosis not present

## 2022-06-04 DIAGNOSIS — E1165 Type 2 diabetes mellitus with hyperglycemia: Secondary | ICD-10-CM | POA: Diagnosis not present

## 2022-06-04 DIAGNOSIS — I10 Essential (primary) hypertension: Secondary | ICD-10-CM | POA: Diagnosis not present

## 2022-06-04 DIAGNOSIS — Z299 Encounter for prophylactic measures, unspecified: Secondary | ICD-10-CM | POA: Diagnosis not present

## 2022-06-09 DIAGNOSIS — E119 Type 2 diabetes mellitus without complications: Secondary | ICD-10-CM | POA: Diagnosis not present

## 2022-06-11 DIAGNOSIS — M545 Low back pain, unspecified: Secondary | ICD-10-CM | POA: Diagnosis not present

## 2022-06-11 DIAGNOSIS — R202 Paresthesia of skin: Secondary | ICD-10-CM | POA: Diagnosis not present

## 2022-06-11 DIAGNOSIS — M48061 Spinal stenosis, lumbar region without neurogenic claudication: Secondary | ICD-10-CM | POA: Diagnosis not present

## 2022-06-18 DIAGNOSIS — I10 Essential (primary) hypertension: Secondary | ICD-10-CM | POA: Diagnosis not present

## 2022-06-18 DIAGNOSIS — Z299 Encounter for prophylactic measures, unspecified: Secondary | ICD-10-CM | POA: Diagnosis not present

## 2022-06-18 DIAGNOSIS — M549 Dorsalgia, unspecified: Secondary | ICD-10-CM | POA: Diagnosis not present

## 2022-06-25 DIAGNOSIS — M5126 Other intervertebral disc displacement, lumbar region: Secondary | ICD-10-CM | POA: Diagnosis not present

## 2022-06-25 DIAGNOSIS — M5416 Radiculopathy, lumbar region: Secondary | ICD-10-CM | POA: Diagnosis not present

## 2022-06-29 ENCOUNTER — Other Ambulatory Visit: Payer: Self-pay | Admitting: Neurological Surgery

## 2022-06-29 DIAGNOSIS — M5416 Radiculopathy, lumbar region: Secondary | ICD-10-CM

## 2022-07-08 ENCOUNTER — Ambulatory Visit
Admission: RE | Admit: 2022-07-08 | Discharge: 2022-07-08 | Disposition: A | Discharge status: Home / Self Care | Payer: Medicare Other | Source: Ambulatory Visit | Attending: Neurological Surgery | Admitting: Neurological Surgery

## 2022-07-08 DIAGNOSIS — M5416 Radiculopathy, lumbar region: Secondary | ICD-10-CM

## 2022-07-08 DIAGNOSIS — M47817 Spondylosis without myelopathy or radiculopathy, lumbosacral region: Secondary | ICD-10-CM | POA: Diagnosis not present

## 2022-07-08 DIAGNOSIS — M5126 Other intervertebral disc displacement, lumbar region: Secondary | ICD-10-CM | POA: Diagnosis not present

## 2022-07-08 MED ORDER — IOPAMIDOL (ISOVUE-M 200) INJECTION 41%
1.0000 mL | Freq: Once | INTRAMUSCULAR | Status: AC
  Administered 2022-07-08: 1 mL via EPIDURAL

## 2022-07-08 MED ORDER — METHYLPREDNISOLONE ACETATE 40 MG/ML INJ SUSP (RADIOLOG
80.0000 mg | Freq: Once | INTRAMUSCULAR | Status: AC
  Administered 2022-07-08: 80 mg via EPIDURAL

## 2022-07-08 NOTE — Discharge Instructions (Signed)

## 2022-07-09 DIAGNOSIS — E119 Type 2 diabetes mellitus without complications: Secondary | ICD-10-CM | POA: Diagnosis not present

## 2022-07-23 DIAGNOSIS — I10 Essential (primary) hypertension: Secondary | ICD-10-CM | POA: Diagnosis not present

## 2022-07-23 DIAGNOSIS — E1165 Type 2 diabetes mellitus with hyperglycemia: Secondary | ICD-10-CM | POA: Diagnosis not present

## 2022-07-23 DIAGNOSIS — Z6828 Body mass index (BMI) 28.0-28.9, adult: Secondary | ICD-10-CM | POA: Diagnosis not present

## 2022-07-23 DIAGNOSIS — M549 Dorsalgia, unspecified: Secondary | ICD-10-CM | POA: Diagnosis not present

## 2022-07-23 DIAGNOSIS — Z23 Encounter for immunization: Secondary | ICD-10-CM | POA: Diagnosis not present

## 2022-07-23 DIAGNOSIS — Z299 Encounter for prophylactic measures, unspecified: Secondary | ICD-10-CM | POA: Diagnosis not present

## 2022-07-30 DIAGNOSIS — M5126 Other intervertebral disc displacement, lumbar region: Secondary | ICD-10-CM | POA: Diagnosis not present

## 2022-08-05 ENCOUNTER — Other Ambulatory Visit: Payer: Self-pay | Admitting: Neurological Surgery

## 2022-08-05 DIAGNOSIS — M5126 Other intervertebral disc displacement, lumbar region: Secondary | ICD-10-CM

## 2022-08-09 DIAGNOSIS — E119 Type 2 diabetes mellitus without complications: Secondary | ICD-10-CM | POA: Diagnosis not present

## 2022-08-12 ENCOUNTER — Ambulatory Visit
Admission: RE | Admit: 2022-08-12 | Discharge: 2022-08-12 | Disposition: A | Discharge status: Home / Self Care | Payer: Medicare Other | Source: Ambulatory Visit | Attending: Neurological Surgery | Admitting: Neurological Surgery

## 2022-08-12 DIAGNOSIS — M47817 Spondylosis without myelopathy or radiculopathy, lumbosacral region: Secondary | ICD-10-CM | POA: Diagnosis not present

## 2022-08-12 DIAGNOSIS — M5126 Other intervertebral disc displacement, lumbar region: Secondary | ICD-10-CM

## 2022-08-12 DIAGNOSIS — M48061 Spinal stenosis, lumbar region without neurogenic claudication: Secondary | ICD-10-CM | POA: Diagnosis not present

## 2022-08-12 MED ORDER — IOPAMIDOL (ISOVUE-M 200) INJECTION 41%
1.0000 mL | Freq: Once | INTRAMUSCULAR | Status: AC
  Administered 2022-08-12: 1 mL via EPIDURAL

## 2022-08-12 MED ORDER — METHYLPREDNISOLONE ACETATE 40 MG/ML INJ SUSP (RADIOLOG
80.0000 mg | Freq: Once | INTRAMUSCULAR | Status: AC
  Administered 2022-08-12: 80 mg via EPIDURAL

## 2022-08-12 NOTE — Discharge Instructions (Signed)

## 2022-09-08 DIAGNOSIS — E119 Type 2 diabetes mellitus without complications: Secondary | ICD-10-CM | POA: Diagnosis not present

## 2022-09-10 DIAGNOSIS — M5126 Other intervertebral disc displacement, lumbar region: Secondary | ICD-10-CM | POA: Diagnosis not present

## 2022-09-10 DIAGNOSIS — M48062 Spinal stenosis, lumbar region with neurogenic claudication: Secondary | ICD-10-CM | POA: Diagnosis not present

## 2022-10-08 DIAGNOSIS — E119 Type 2 diabetes mellitus without complications: Secondary | ICD-10-CM | POA: Diagnosis not present

## 2022-10-10 IMAGING — MG MM DIGITAL SCREENING BILAT W/ TOMO AND CAD
8 series · 8 of 24 positions shown · non-contrast
Comparison: Previous exam(s).

CLINICAL DATA: Screening.

EXAM:
DIGITAL SCREENING BILATERAL MAMMOGRAM WITH TOMOSYNTHESIS AND CAD
TECHNIQUE: Bilateral screening digital craniocaudal and mediolateral oblique
mammograms were obtained. Bilateral screening digital breast
tomosynthesis was performed. The images were evaluated with
computer-aided detection.

[R MLO synth-2D]
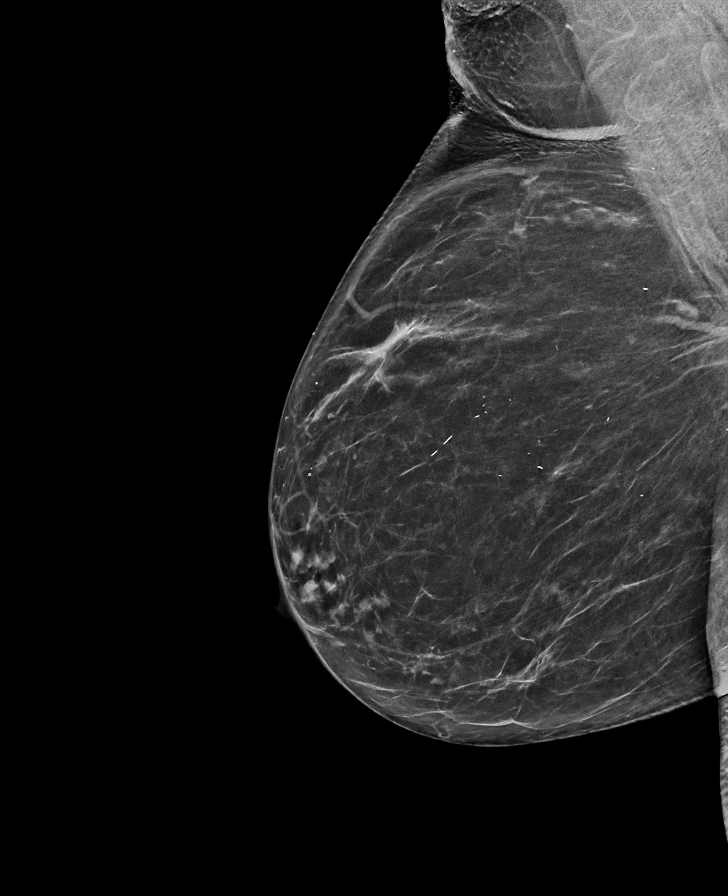

[R CC synth-2D]
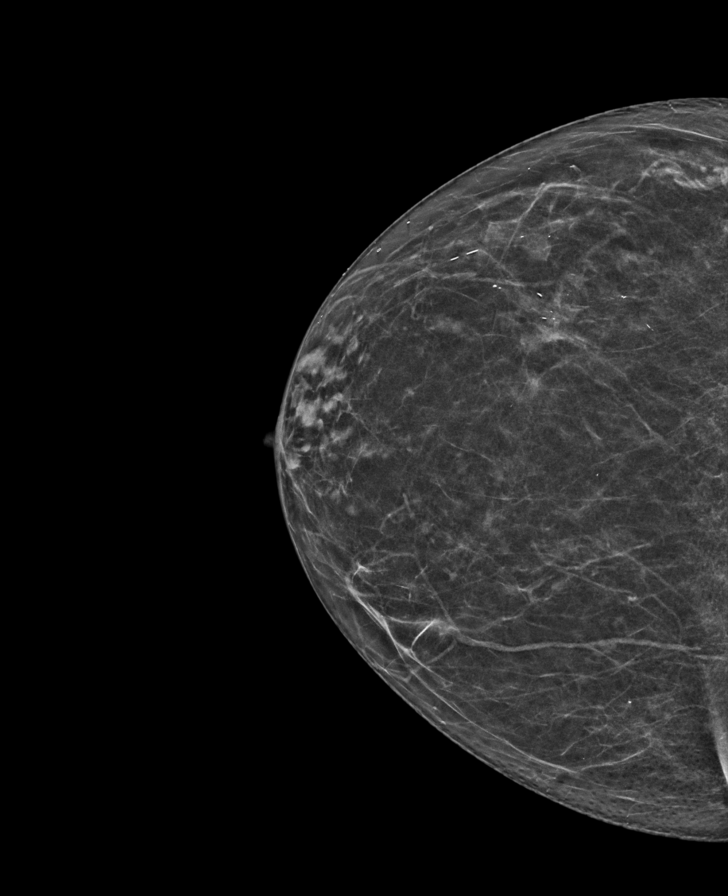

[L MLO synth-2D]
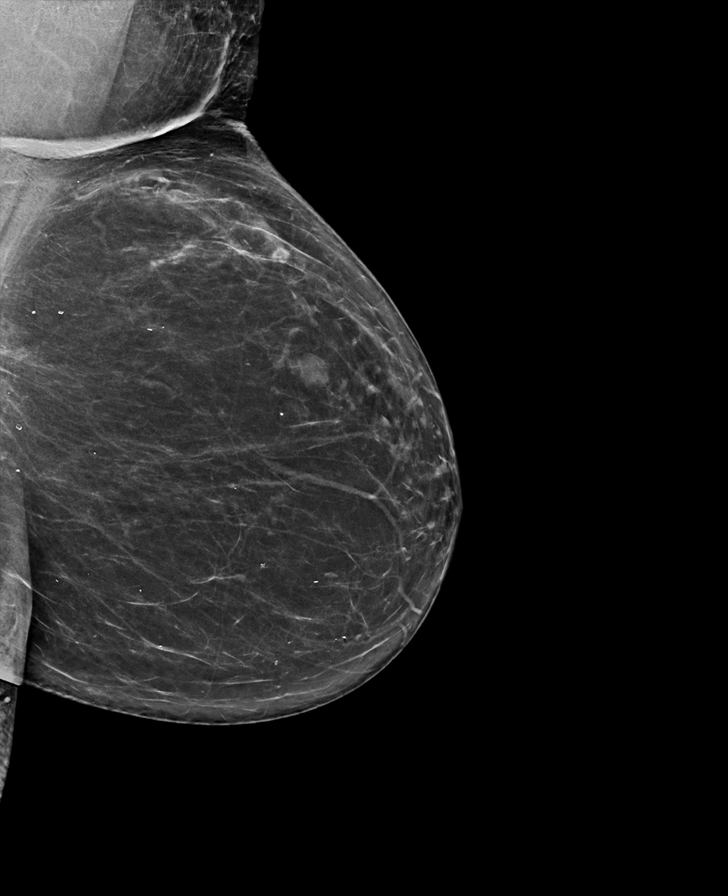

[L CC synth-2D]
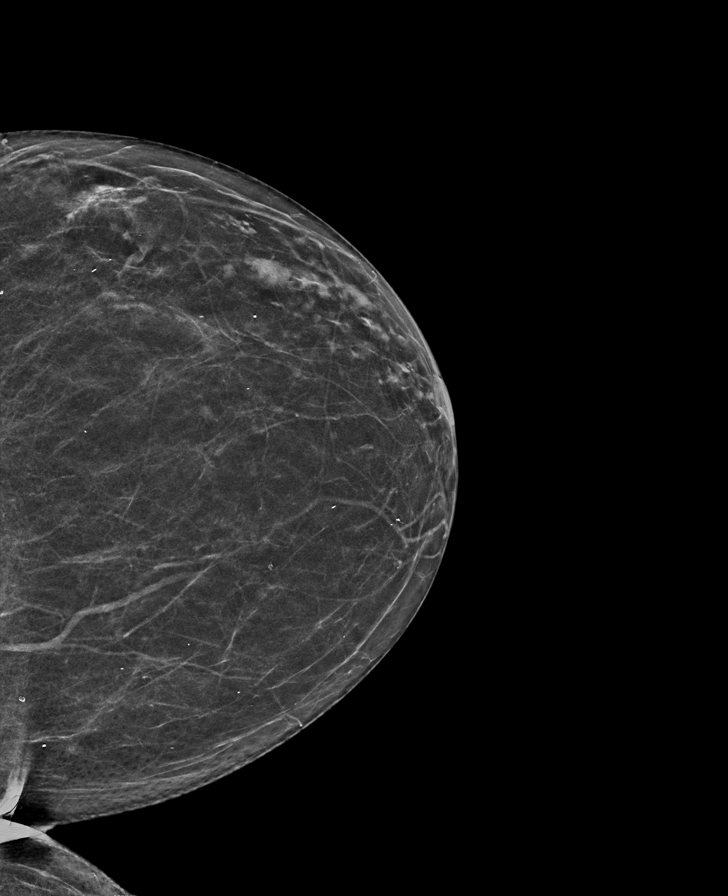

[R CC tomo · tomo slice 27/54.0]
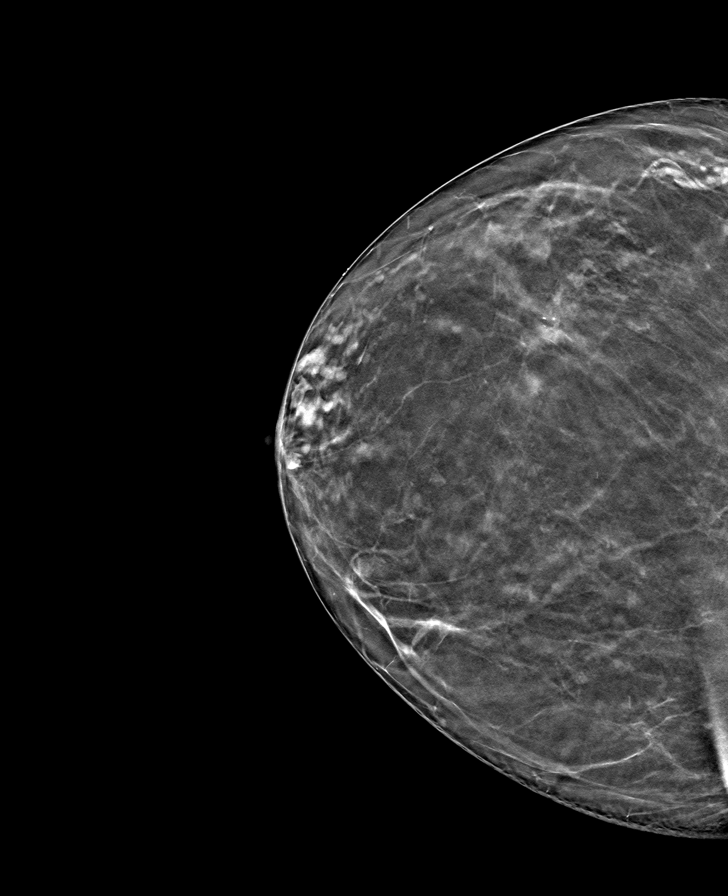

[L MLO tomo · tomo slice 37/72.0]
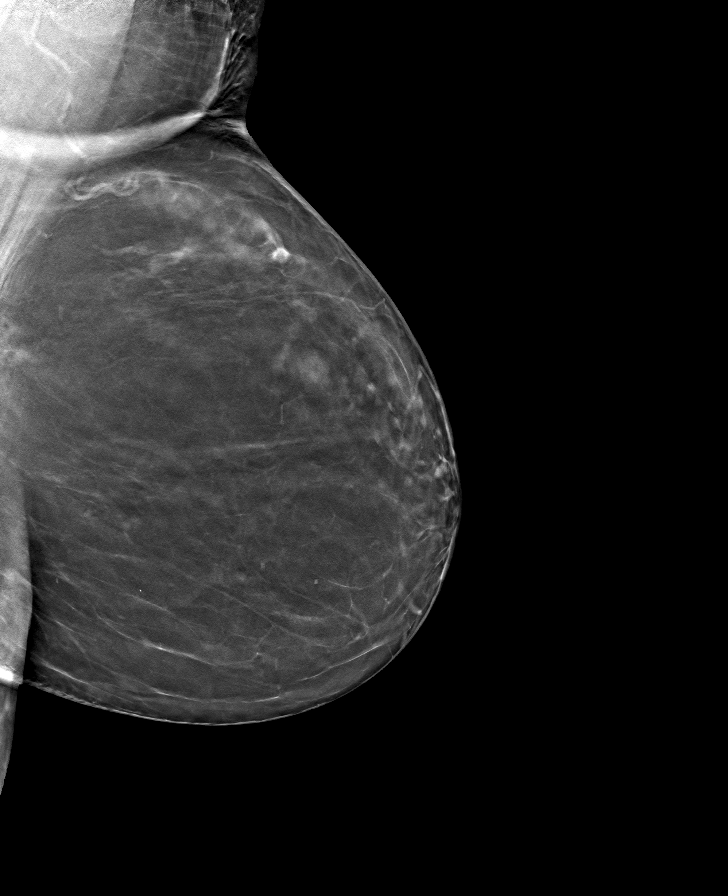

[L CC tomo · tomo slice 29/56.0]
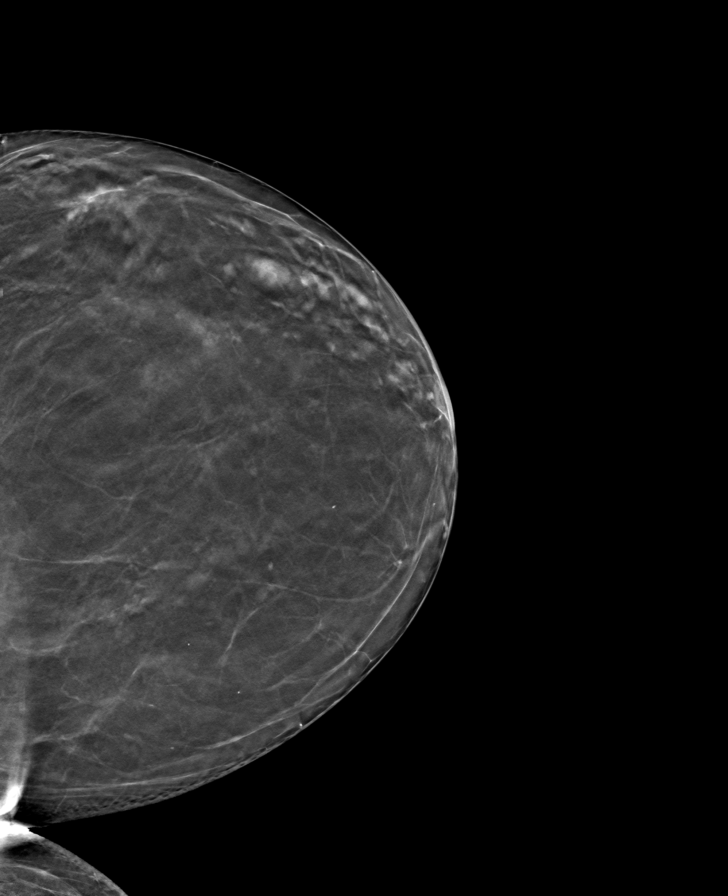

[R MLO tomo · tomo slice 35/68.0]
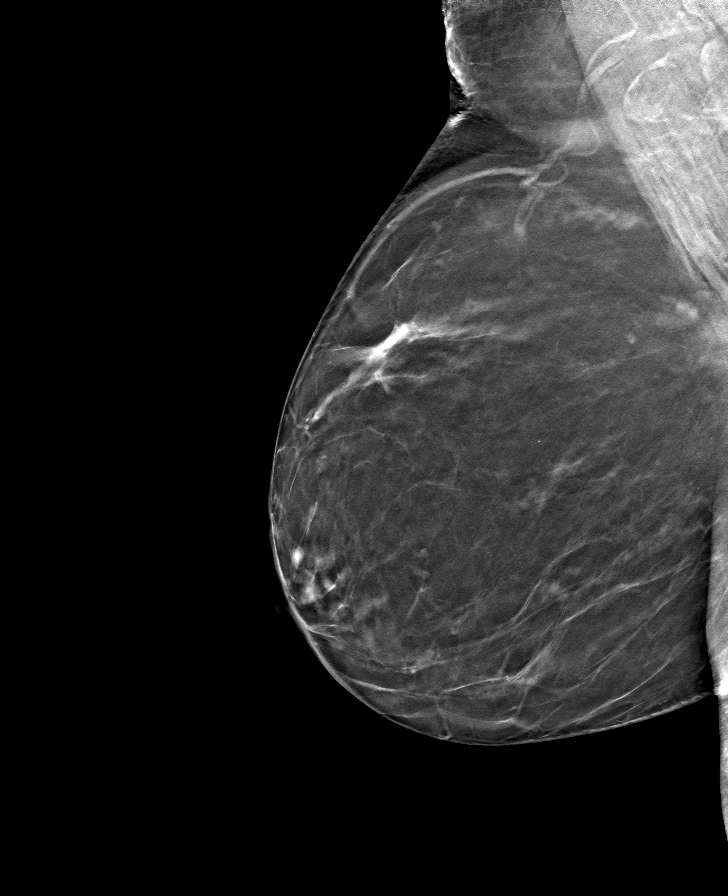

[8 of 24 positions shown; findings below may reference images not displayed]

ACR Breast Density Category b: There are scattered areas of
fibroglandular density.
FINDINGS: There are no findings suspicious for malignancy. The images were
evaluated with computer-aided detection.
IMPRESSION: No mammographic evidence of malignancy. A result letter of this
screening mammogram will be mailed directly to the patient.

RECOMMENDATION:
Screening mammogram in one year. (Code:[id number])

BI-RADS CATEGORY  1: Negative.

## 2022-11-08 DIAGNOSIS — E119 Type 2 diabetes mellitus without complications: Secondary | ICD-10-CM | POA: Diagnosis not present

## 2022-12-08 DIAGNOSIS — E119 Type 2 diabetes mellitus without complications: Secondary | ICD-10-CM | POA: Diagnosis not present

## 2022-12-16 DIAGNOSIS — Z6829 Body mass index (BMI) 29.0-29.9, adult: Secondary | ICD-10-CM | POA: Diagnosis not present

## 2022-12-16 DIAGNOSIS — Z7189 Other specified counseling: Secondary | ICD-10-CM | POA: Diagnosis not present

## 2022-12-16 DIAGNOSIS — E1165 Type 2 diabetes mellitus with hyperglycemia: Secondary | ICD-10-CM | POA: Diagnosis not present

## 2022-12-16 DIAGNOSIS — I10 Essential (primary) hypertension: Secondary | ICD-10-CM | POA: Diagnosis not present

## 2022-12-16 DIAGNOSIS — E559 Vitamin D deficiency, unspecified: Secondary | ICD-10-CM | POA: Diagnosis not present

## 2022-12-16 DIAGNOSIS — Z0000 Encounter for general adult medical examination without abnormal findings: Secondary | ICD-10-CM | POA: Diagnosis not present

## 2022-12-16 DIAGNOSIS — Z1331 Encounter for screening for depression: Secondary | ICD-10-CM | POA: Diagnosis not present

## 2022-12-16 DIAGNOSIS — E7800 Pure hypercholesterolemia, unspecified: Secondary | ICD-10-CM | POA: Diagnosis not present

## 2022-12-16 DIAGNOSIS — Z299 Encounter for prophylactic measures, unspecified: Secondary | ICD-10-CM | POA: Diagnosis not present

## 2022-12-16 DIAGNOSIS — Z79899 Other long term (current) drug therapy: Secondary | ICD-10-CM | POA: Diagnosis not present

## 2022-12-16 DIAGNOSIS — R5383 Other fatigue: Secondary | ICD-10-CM | POA: Diagnosis not present

## 2022-12-16 DIAGNOSIS — Z1339 Encounter for screening examination for other mental health and behavioral disorders: Secondary | ICD-10-CM | POA: Diagnosis not present

## 2023-01-08 DIAGNOSIS — E119 Type 2 diabetes mellitus without complications: Secondary | ICD-10-CM | POA: Diagnosis not present

## 2023-01-19 DIAGNOSIS — H40033 Anatomical narrow angle, bilateral: Secondary | ICD-10-CM | POA: Diagnosis not present

## 2023-01-19 DIAGNOSIS — H2513 Age-related nuclear cataract, bilateral: Secondary | ICD-10-CM | POA: Diagnosis not present

## 2023-02-01 DIAGNOSIS — E1165 Type 2 diabetes mellitus with hyperglycemia: Secondary | ICD-10-CM | POA: Diagnosis not present

## 2023-02-01 DIAGNOSIS — E1142 Type 2 diabetes mellitus with diabetic polyneuropathy: Secondary | ICD-10-CM | POA: Diagnosis not present

## 2023-02-01 DIAGNOSIS — I10 Essential (primary) hypertension: Secondary | ICD-10-CM | POA: Diagnosis not present

## 2023-02-01 DIAGNOSIS — N183 Chronic kidney disease, stage 3 unspecified: Secondary | ICD-10-CM | POA: Diagnosis not present

## 2023-02-01 DIAGNOSIS — Z299 Encounter for prophylactic measures, unspecified: Secondary | ICD-10-CM | POA: Diagnosis not present

## 2023-02-01 DIAGNOSIS — E1122 Type 2 diabetes mellitus with diabetic chronic kidney disease: Secondary | ICD-10-CM | POA: Diagnosis not present

## 2023-02-08 DIAGNOSIS — E119 Type 2 diabetes mellitus without complications: Secondary | ICD-10-CM | POA: Diagnosis not present

## 2023-03-11 DIAGNOSIS — E119 Type 2 diabetes mellitus without complications: Secondary | ICD-10-CM | POA: Diagnosis not present

## 2023-03-29 DIAGNOSIS — E1122 Type 2 diabetes mellitus with diabetic chronic kidney disease: Secondary | ICD-10-CM | POA: Diagnosis not present

## 2023-03-29 DIAGNOSIS — I10 Essential (primary) hypertension: Secondary | ICD-10-CM | POA: Diagnosis not present

## 2023-03-29 DIAGNOSIS — E1165 Type 2 diabetes mellitus with hyperglycemia: Secondary | ICD-10-CM | POA: Diagnosis not present

## 2023-03-29 DIAGNOSIS — Z299 Encounter for prophylactic measures, unspecified: Secondary | ICD-10-CM | POA: Diagnosis not present

## 2023-03-29 DIAGNOSIS — N183 Chronic kidney disease, stage 3 unspecified: Secondary | ICD-10-CM | POA: Diagnosis not present

## 2023-04-10 DIAGNOSIS — E119 Type 2 diabetes mellitus without complications: Secondary | ICD-10-CM | POA: Diagnosis not present

## 2023-04-28 DIAGNOSIS — Z299 Encounter for prophylactic measures, unspecified: Secondary | ICD-10-CM | POA: Diagnosis not present

## 2023-04-28 DIAGNOSIS — I10 Essential (primary) hypertension: Secondary | ICD-10-CM | POA: Diagnosis not present

## 2023-04-28 DIAGNOSIS — E1122 Type 2 diabetes mellitus with diabetic chronic kidney disease: Secondary | ICD-10-CM | POA: Diagnosis not present

## 2023-04-28 DIAGNOSIS — E1165 Type 2 diabetes mellitus with hyperglycemia: Secondary | ICD-10-CM | POA: Diagnosis not present

## 2023-04-28 DIAGNOSIS — N183 Chronic kidney disease, stage 3 unspecified: Secondary | ICD-10-CM | POA: Diagnosis not present

## 2023-05-11 DIAGNOSIS — E119 Type 2 diabetes mellitus without complications: Secondary | ICD-10-CM | POA: Diagnosis not present

## 2023-06-11 DIAGNOSIS — E119 Type 2 diabetes mellitus without complications: Secondary | ICD-10-CM | POA: Diagnosis not present

## 2023-07-04 DIAGNOSIS — Z299 Encounter for prophylactic measures, unspecified: Secondary | ICD-10-CM | POA: Diagnosis not present

## 2023-07-04 DIAGNOSIS — E1129 Type 2 diabetes mellitus with other diabetic kidney complication: Secondary | ICD-10-CM | POA: Diagnosis not present

## 2023-07-04 DIAGNOSIS — N183 Chronic kidney disease, stage 3 unspecified: Secondary | ICD-10-CM | POA: Diagnosis not present

## 2023-07-04 DIAGNOSIS — I10 Essential (primary) hypertension: Secondary | ICD-10-CM | POA: Diagnosis not present

## 2023-07-04 DIAGNOSIS — E1122 Type 2 diabetes mellitus with diabetic chronic kidney disease: Secondary | ICD-10-CM | POA: Diagnosis not present

## 2023-07-04 DIAGNOSIS — R809 Proteinuria, unspecified: Secondary | ICD-10-CM | POA: Diagnosis not present

## 2023-07-04 DIAGNOSIS — Z23 Encounter for immunization: Secondary | ICD-10-CM | POA: Diagnosis not present

## 2023-07-11 DIAGNOSIS — E119 Type 2 diabetes mellitus without complications: Secondary | ICD-10-CM | POA: Diagnosis not present

## 2023-08-10 DIAGNOSIS — E119 Type 2 diabetes mellitus without complications: Secondary | ICD-10-CM | POA: Diagnosis not present

## 2023-09-09 DIAGNOSIS — E119 Type 2 diabetes mellitus without complications: Secondary | ICD-10-CM | POA: Diagnosis not present

## 2023-10-10 DIAGNOSIS — E119 Type 2 diabetes mellitus without complications: Secondary | ICD-10-CM | POA: Diagnosis not present

## 2023-11-09 DIAGNOSIS — E119 Type 2 diabetes mellitus without complications: Secondary | ICD-10-CM | POA: Diagnosis not present

## 2023-12-09 DIAGNOSIS — E119 Type 2 diabetes mellitus without complications: Secondary | ICD-10-CM | POA: Diagnosis not present

## 2023-12-19 DIAGNOSIS — Z1339 Encounter for screening examination for other mental health and behavioral disorders: Secondary | ICD-10-CM | POA: Diagnosis not present

## 2023-12-19 DIAGNOSIS — Z0000 Encounter for general adult medical examination without abnormal findings: Secondary | ICD-10-CM | POA: Diagnosis not present

## 2023-12-19 DIAGNOSIS — I10 Essential (primary) hypertension: Secondary | ICD-10-CM | POA: Diagnosis not present

## 2023-12-19 DIAGNOSIS — Z1331 Encounter for screening for depression: Secondary | ICD-10-CM | POA: Diagnosis not present

## 2023-12-19 DIAGNOSIS — Z79899 Other long term (current) drug therapy: Secondary | ICD-10-CM | POA: Diagnosis not present

## 2023-12-19 DIAGNOSIS — Z7189 Other specified counseling: Secondary | ICD-10-CM | POA: Diagnosis not present

## 2023-12-19 DIAGNOSIS — Z299 Encounter for prophylactic measures, unspecified: Secondary | ICD-10-CM | POA: Diagnosis not present

## 2023-12-19 DIAGNOSIS — N183 Chronic kidney disease, stage 3 unspecified: Secondary | ICD-10-CM | POA: Diagnosis not present

## 2023-12-19 DIAGNOSIS — R5383 Other fatigue: Secondary | ICD-10-CM | POA: Diagnosis not present

## 2023-12-19 DIAGNOSIS — E7800 Pure hypercholesterolemia, unspecified: Secondary | ICD-10-CM | POA: Diagnosis not present

## 2023-12-19 DIAGNOSIS — E559 Vitamin D deficiency, unspecified: Secondary | ICD-10-CM | POA: Diagnosis not present

## 2023-12-19 DIAGNOSIS — E1169 Type 2 diabetes mellitus with other specified complication: Secondary | ICD-10-CM | POA: Diagnosis not present

## 2024-01-08 DIAGNOSIS — E119 Type 2 diabetes mellitus without complications: Secondary | ICD-10-CM | POA: Diagnosis not present

## 2024-02-01 DIAGNOSIS — H401131 Primary open-angle glaucoma, bilateral, mild stage: Secondary | ICD-10-CM | POA: Diagnosis not present

## 2024-02-01 DIAGNOSIS — H2513 Age-related nuclear cataract, bilateral: Secondary | ICD-10-CM | POA: Diagnosis not present

## 2024-02-01 DIAGNOSIS — H2511 Age-related nuclear cataract, right eye: Secondary | ICD-10-CM | POA: Diagnosis not present

## 2024-02-08 DIAGNOSIS — E119 Type 2 diabetes mellitus without complications: Secondary | ICD-10-CM | POA: Diagnosis not present

## 2024-03-10 DIAGNOSIS — E119 Type 2 diabetes mellitus without complications: Secondary | ICD-10-CM | POA: Diagnosis not present

## 2024-03-20 DIAGNOSIS — N183 Chronic kidney disease, stage 3 unspecified: Secondary | ICD-10-CM | POA: Diagnosis not present

## 2024-03-20 DIAGNOSIS — I10 Essential (primary) hypertension: Secondary | ICD-10-CM | POA: Diagnosis not present

## 2024-03-20 DIAGNOSIS — E1122 Type 2 diabetes mellitus with diabetic chronic kidney disease: Secondary | ICD-10-CM | POA: Diagnosis not present

## 2024-03-20 DIAGNOSIS — Z299 Encounter for prophylactic measures, unspecified: Secondary | ICD-10-CM | POA: Diagnosis not present

## 2024-03-20 DIAGNOSIS — E1169 Type 2 diabetes mellitus with other specified complication: Secondary | ICD-10-CM | POA: Diagnosis not present

## 2024-04-09 DIAGNOSIS — E119 Type 2 diabetes mellitus without complications: Secondary | ICD-10-CM | POA: Diagnosis not present

## 2024-04-24 DIAGNOSIS — H401111 Primary open-angle glaucoma, right eye, mild stage: Secondary | ICD-10-CM | POA: Diagnosis not present

## 2024-04-24 DIAGNOSIS — H5711 Ocular pain, right eye: Secondary | ICD-10-CM | POA: Diagnosis not present

## 2024-04-24 DIAGNOSIS — H2511 Age-related nuclear cataract, right eye: Secondary | ICD-10-CM | POA: Diagnosis not present

## 2024-05-10 DIAGNOSIS — E119 Type 2 diabetes mellitus without complications: Secondary | ICD-10-CM | POA: Diagnosis not present

## 2024-05-11 DIAGNOSIS — H2512 Age-related nuclear cataract, left eye: Secondary | ICD-10-CM | POA: Diagnosis not present

## 2024-05-11 DIAGNOSIS — H401121 Primary open-angle glaucoma, left eye, mild stage: Secondary | ICD-10-CM | POA: Diagnosis not present

## 2024-06-09 DIAGNOSIS — E119 Type 2 diabetes mellitus without complications: Secondary | ICD-10-CM | POA: Diagnosis not present

## 2024-06-21 DIAGNOSIS — Z299 Encounter for prophylactic measures, unspecified: Secondary | ICD-10-CM | POA: Diagnosis not present

## 2024-06-21 DIAGNOSIS — N183 Chronic kidney disease, stage 3 unspecified: Secondary | ICD-10-CM | POA: Diagnosis not present

## 2024-06-21 DIAGNOSIS — I10 Essential (primary) hypertension: Secondary | ICD-10-CM | POA: Diagnosis not present

## 2024-06-21 DIAGNOSIS — E119 Type 2 diabetes mellitus without complications: Secondary | ICD-10-CM | POA: Diagnosis not present

## 2024-06-21 DIAGNOSIS — Z23 Encounter for immunization: Secondary | ICD-10-CM | POA: Diagnosis not present

## 2024-07-10 DIAGNOSIS — E119 Type 2 diabetes mellitus without complications: Secondary | ICD-10-CM | POA: Diagnosis not present

## 2024-07-16 DIAGNOSIS — E2839 Other primary ovarian failure: Secondary | ICD-10-CM | POA: Diagnosis not present
